# Patient Record
Sex: Female | Born: 1995 | Race: White | Hispanic: No | Marital: Single | Smoking: Never smoker
Health system: Southern US, Community
[De-identification: ages and names within clinical notes are randomized; demographics above are authoritative.]

## PROBLEM LIST (undated history)

## (undated) DIAGNOSIS — F419 Anxiety disorder, unspecified: Secondary | ICD-10-CM

## (undated) DIAGNOSIS — F329 Major depressive disorder, single episode, unspecified: Secondary | ICD-10-CM

## (undated) DIAGNOSIS — F12288 Cannabis dependence with other cannabis-induced disorder: Secondary | ICD-10-CM

## (undated) DIAGNOSIS — F32A Depression, unspecified: Secondary | ICD-10-CM

---
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
## 2015-07-11 IMAGING — CT CT ABD-PELV W/ CM
2 of 4 series · 16 of 46 positions shown, 18 images · IV contrast (omnipaque)
Comparison: Prior ultrasound from 04/09/2014

CLINICAL DATA: Initial evaluation for diffuse in the pain, greatest
within the right lower quadrant, with vomiting.

EXAM:
CT ABDOMEN AND PELVIS WITH CONTRAST
TECHNIQUE: Multidetector CT imaging of the abdomen and pelvis was performed
using the standard protocol following bolus administration of
intravenous contrast.
CONTRAST:  100 cc of Omnipaque 300.

[Series 2: routine abd pel with · axial · 0.69mm/px · z∈[-517,-82]mm · 13 of 95 slices shown, 15 images]
[im 4/95  soft-tissue]
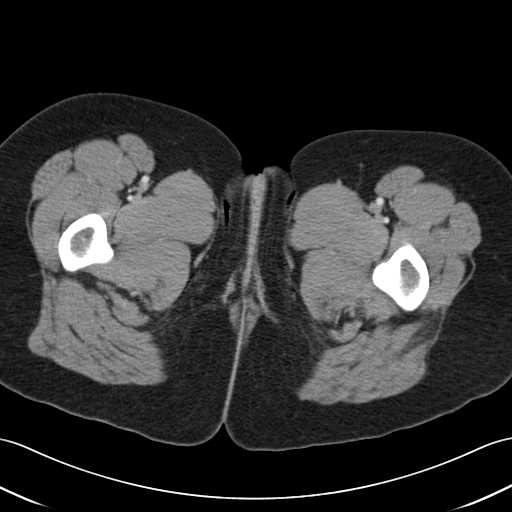
[im 4/95  bone]
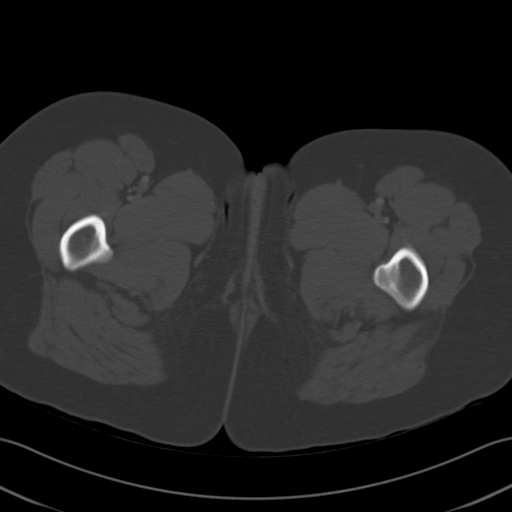
[im 12/95  soft-tissue]
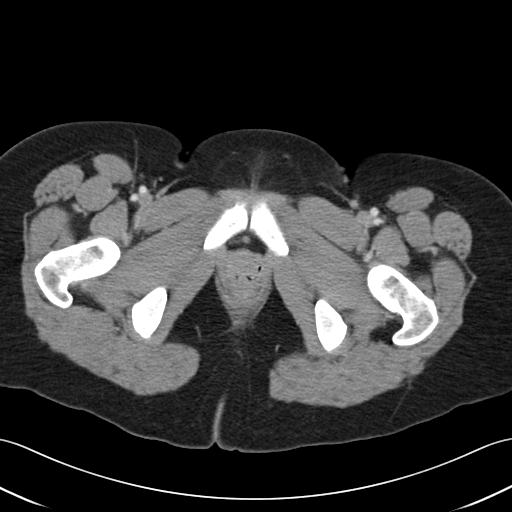
[im 20/95  soft-tissue]
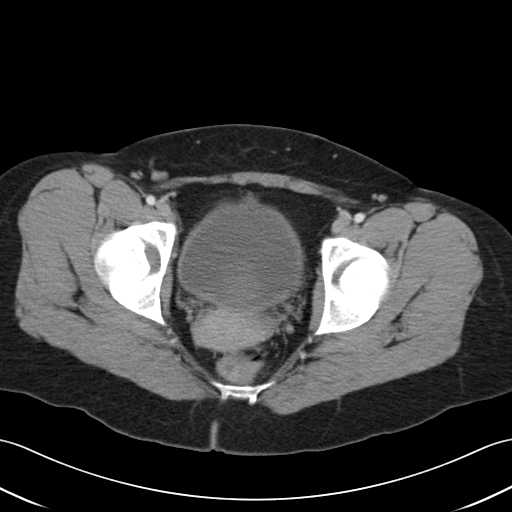
[im 28/95  soft-tissue]
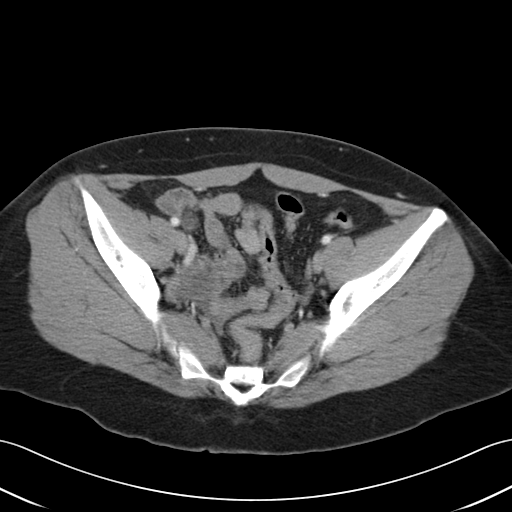
[im 32/95  soft-tissue]
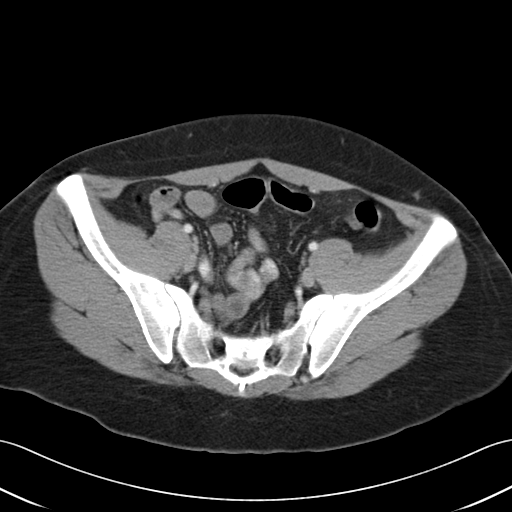
[im 40/95  soft-tissue]
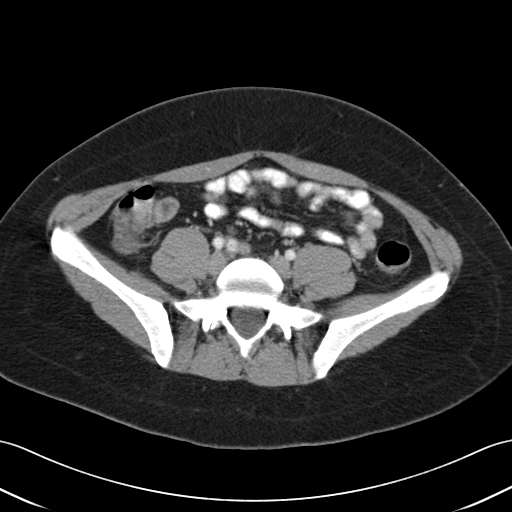
[im 48/95  soft-tissue]
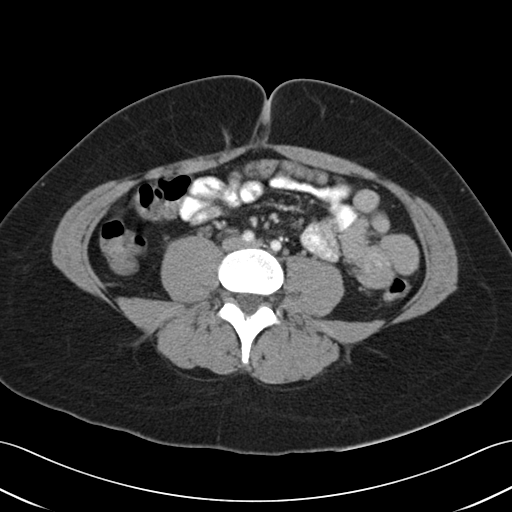
[im 55/95  soft-tissue]
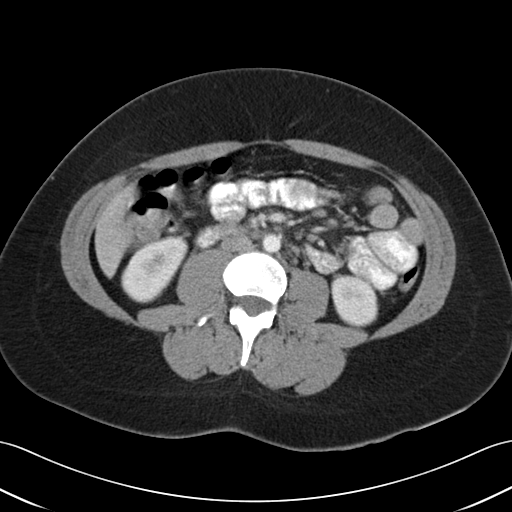
[im 63/95  soft-tissue]
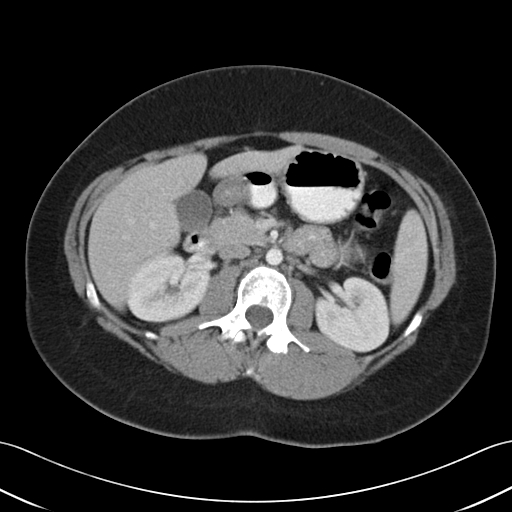
[im 63/95  bone]
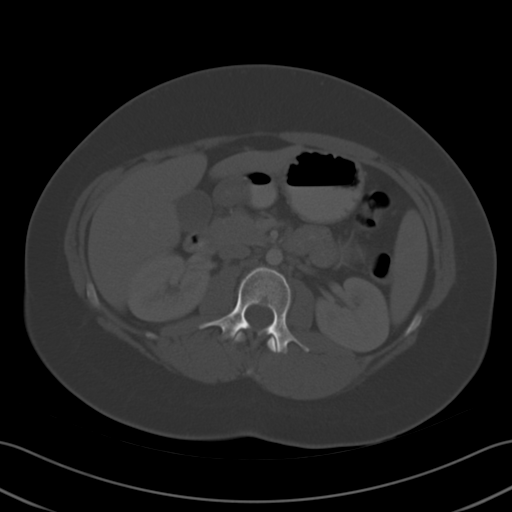
[im 67/95  soft-tissue]
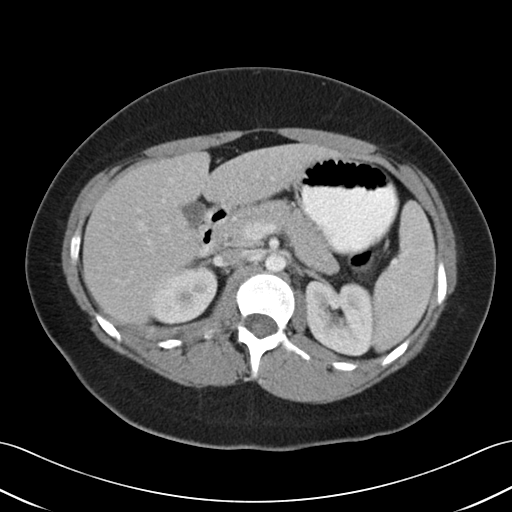
[im 75/95  soft-tissue]
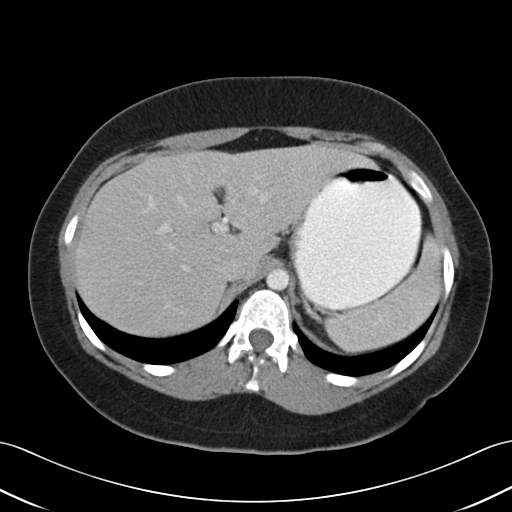
[im 83/95  soft-tissue]
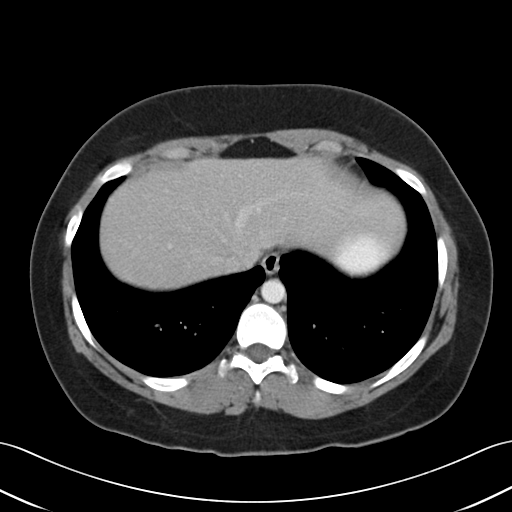
[im 91/95  soft-tissue]
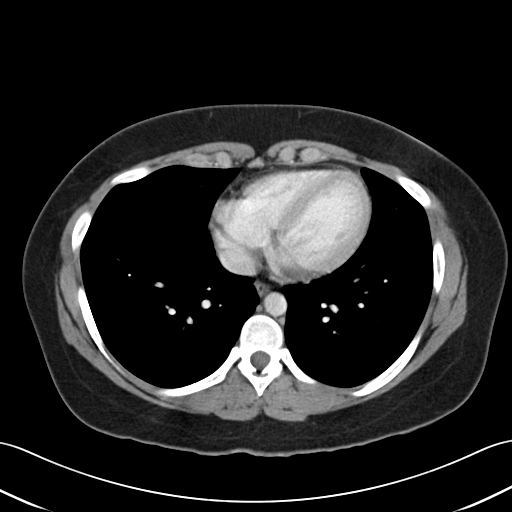

[Series 5: cor routine abd pel with · coronal · 0.96mm/px · 3 of 100 slices shown]
[im 34/100  soft-tissue]
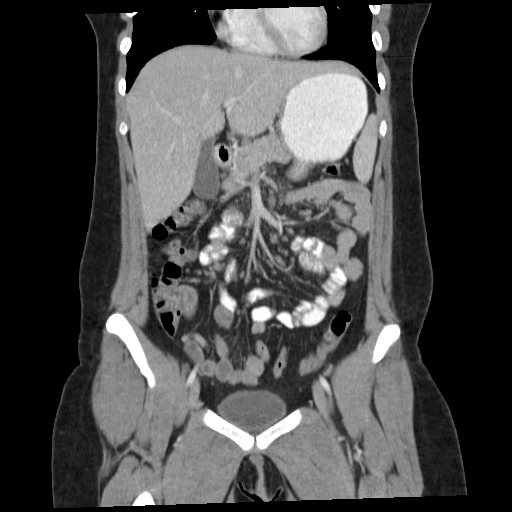
[im 45/100  soft-tissue]
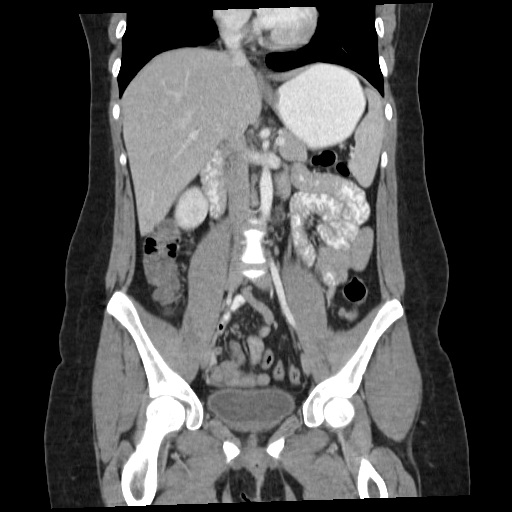
[im 56/100  soft-tissue]
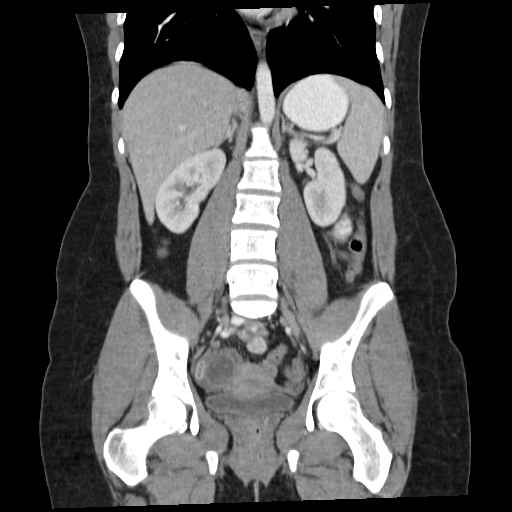

[16 of 46 positions shown; findings below may reference images not displayed]

FINDINGS: Visualized lung bases are clear. No pleural or pericardial effusion.

The liver demonstrates a normal contrast enhanced appearance.
Gallbladder within normal limits. No biliary dilatation. Spleen,
adrenal glands, and pancreas demonstrate a normal contrast enhanced
appearance.

Kidneys are equal in size with symmetric enhancement. No
nephrolithiasis, hydronephrosis, or focal enhancing renal mass.

Stomach within normal limits. No evidence for bowel obstruction.
Appendix well visualized in the right lower quadrant and is of
normal caliber measuring 5 mm in diameter. No associated
inflammatory changes to suggest acute appendicitis. No abnormal wall
thickening, mucosal enhancement, or inflammatory fat stranding seen
elsewhere about the bowels.

Mild circumferential bladder wall thickening likely related
incomplete distension. Tiny focal thickening of 5 mm at the anterior
bladder wall may reflect a small urachal remnant, of doubtful
clinical significant (series 2, image 77).

Small amount of free fluid present within the endometrial canal.
Uterus otherwise unremarkable. Left ovary normal. There is a in
x 2.8 x 2.5 cm unilocular cyst within the right ovary, likely a
normal physiologic cyst (series 2, image 70). There is an adjacent
degenerating corpus luteal cyst. Small volume free fluid present
within the pelvis, likely physiologic.

No free air. No pathologically enlarged intra-abdominal pelvic lymph
nodes. Normal intravascular enhancement seen throughout the
intra-abdominal aorta and its branch vessels.

No acute osseous abnormality. No worrisome lytic or blastic osseous
lesion.
IMPRESSION: 1. 2.9 x 2.8 x 2.5 cm unilocular cyst within the right ovary, likely
a normal physiologic cyst. There is an adjacent degenerating corpus
luteal cyst within the right ovary as well. This is felt to likely
represent the source of the patient's right lower quadrant abdominal
pain. Associated small volume free fluid within the pelvis likely
physiologic in nature.
2. Normal appendix.
3. No other acute intra-abdominal or pelvic process.

## 2017-06-06 ENCOUNTER — Encounter: Payer: Self-pay | Admitting: Emergency Medicine

## 2017-06-06 ENCOUNTER — Emergency Department
Admission: EM | Admit: 2017-06-06 | Discharge: 2017-06-06 | Disposition: A | Discharge status: Home / Self Care | Payer: Self-pay | Attending: Emergency Medicine | Admitting: Emergency Medicine

## 2017-06-06 DIAGNOSIS — R1084 Generalized abdominal pain: Secondary | ICD-10-CM | POA: Insufficient documentation

## 2017-06-06 DIAGNOSIS — R112 Nausea with vomiting, unspecified: Secondary | ICD-10-CM | POA: Insufficient documentation

## 2017-06-06 LAB — COMPREHENSIVE METABOLIC PANEL
ALT: 11 U/L — ABNORMAL LOW (ref 14–54)
ANION GAP: 12 (ref 5–15)
AST: 24 U/L (ref 15–41)
Albumin: 5.5 g/dL — ABNORMAL HIGH (ref 3.5–5.0)
Alkaline Phosphatase: 72 U/L (ref 38–126)
BILIRUBIN TOTAL: 1.4 mg/dL — AB (ref 0.3–1.2)
BUN: 16 mg/dL (ref 6–20)
CO2: 24 mmol/L (ref 22–32)
Calcium: 9.9 mg/dL (ref 8.9–10.3)
Chloride: 100 mmol/L — ABNORMAL LOW (ref 101–111)
Creatinine, Ser: 0.83 mg/dL (ref 0.44–1.00)
GFR calc Af Amer: >60 mL/min (ref >60)
Glucose, Bld: 104 mg/dL — ABNORMAL HIGH (ref 65–99)
POTASSIUM: 4.1 mmol/L (ref 3.5–5.1)
Sodium: 136 mmol/L (ref 135–145)
TOTAL PROTEIN: 9.5 g/dL — AB (ref 6.5–8.1)

## 2017-06-06 LAB — URINALYSIS, COMPLETE (UACMP) WITH MICROSCOPIC
BACTERIA UA: NONE SEEN
BILIRUBIN URINE: NEGATIVE
Glucose, UA: NEGATIVE mg/dL
Hgb urine dipstick: NEGATIVE
Ketones, ur: 80 mg/dL — AB
LEUKOCYTES UA: NEGATIVE
Nitrite: NEGATIVE
Protein, ur: 30 mg/dL — AB
SPECIFIC GRAVITY, URINE: 1.028 (ref 1.005–1.030)
pH: 6 (ref 5.0–8.0)

## 2017-06-06 LAB — CBC WITH DIFFERENTIAL/PLATELET
Basophils Absolute: 0 10*3/uL (ref 0–0.1)
Basophils Relative: 0 %
Eosinophils Absolute: 0.1 10*3/uL (ref 0–0.7)
Eosinophils Relative: 1 %
HEMATOCRIT: 45.3 % (ref 35.0–47.0)
HEMOGLOBIN: 15.5 g/dL (ref 12.0–16.0)
LYMPHS PCT: 11 %
Lymphs Abs: 1 10*3/uL (ref 1.0–3.6)
MCH: 30.4 pg (ref 26.0–34.0)
MCHC: 34.2 g/dL (ref 32.0–36.0)
MCV: 89.1 fL (ref 80.0–100.0)
MONO ABS: 0.5 10*3/uL (ref 0.2–0.9)
Monocytes Relative: 6 %
NEUTROS ABS: 8 10*3/uL — AB (ref 1.4–6.5)
Neutrophils Relative %: 82 %
Platelets: 392 10*3/uL (ref 150–440)
RBC: 5.09 MIL/uL (ref 3.80–5.20)
RDW: 12.4 % (ref 11.5–14.5)
WBC: 9.6 10*3/uL (ref 3.6–11.0)

## 2017-06-06 LAB — LIPASE, BLOOD: LIPASE: 18 U/L (ref 11–51)

## 2017-06-06 LAB — POCT PREGNANCY, URINE: PREG TEST UR: NEGATIVE

## 2017-06-06 MED ORDER — FAMOTIDINE IN NACL 20-0.9 MG/50ML-% IV SOLN
20.0000 mg | Freq: Once | INTRAVENOUS | Status: AC
  Administered 2017-06-06: 20 mg via INTRAVENOUS
  Filled 2017-06-06: qty 50

## 2017-06-06 MED ORDER — PROMETHAZINE HCL 25 MG RE SUPP: 25.0000 mg | Freq: Four times a day (QID) | RECTAL | 1 refills | Status: DC | PRN

## 2017-06-06 MED ORDER — PROMETHAZINE HCL 25 MG PO TABS
50.0000 mg | ORAL_TABLET | Freq: Once | ORAL | Status: AC
  Administered 2017-06-06: 50 mg via ORAL
  Filled 2017-06-06: qty 2

## 2017-06-06 MED ORDER — PROMETHAZINE HCL 25 MG PO TABS: 25.0000 mg | ORAL_TABLET | Freq: Four times a day (QID) | ORAL | 0 refills | Status: DC | PRN

## 2017-06-06 MED ORDER — PROMETHAZINE HCL 25 MG/ML IJ SOLN
25.0000 mg | Freq: Once | INTRAMUSCULAR | Status: AC
  Administered 2017-06-06: 25 mg via INTRAVENOUS
  Filled 2017-06-06: qty 1

## 2017-06-06 MED ORDER — MORPHINE SULFATE (PF) 4 MG/ML IV SOLN
4.0000 mg | Freq: Once | INTRAVENOUS | Status: AC
  Administered 2017-06-06: 4 mg via INTRAVENOUS
  Filled 2017-06-06: qty 1

## 2017-06-06 MED ORDER — LORAZEPAM 2 MG/ML IJ SOLN
0.5000 mg | Freq: Once | INTRAMUSCULAR | Status: AC
  Administered 2017-06-06: 0.5 mg via INTRAVENOUS
  Filled 2017-06-06: qty 1

## 2017-06-06 MED ORDER — FAMOTIDINE 20 MG PO TABS: 20.0000 mg | ORAL_TABLET | Freq: Two times a day (BID) | ORAL | 1 refills | Status: DC

## 2017-06-06 MED ORDER — SODIUM CHLORIDE 0.9 % IV SOLN
Freq: Once | INTRAVENOUS | Status: AC
  Administered 2017-06-06: 08:00:00 via INTRAVENOUS

## 2017-06-06 NOTE — ED Notes (Addendum)
Pt states she would like her boyfriend to be out of the room when the MD goes in to update her.  Informed Dr. Mayford KnifeWilliams of this.  Also advised patient that we are still waiting for UA. Pt states she is unable to go at this time.

## 2017-06-06 NOTE — ED Triage Notes (Signed)
Pt with abd pain and n/v since yesterday. Unable to hold anything down

## 2017-06-06 NOTE — ED Provider Notes (Signed)
[facility] Emergency Department Provider Note       Time seen: ----------------------------------------- 7:45 AM on 06/06/2017 -----------------------------------------   I have reviewed the triage vital signs and the nursing notes.  HISTORY   Chief Complaint Abdominal Pain and Emesis    HPI Carolyn Fritz is a 22 y.o. female with no significant past medical history who presents to the ED for abdominal pain with nausea vomiting since yesterday.  Patient is also unable to hold anything down.  Discomfort is 10 out of 10 in the abdomen diffusely, nothing makes it better.  Patient has had multiple presentations to various hospitals in the area in the past.  She denies daily marijuana abuse.  She was told recently to stop antacids because it may cause kidney failure.  History reviewed. No pertinent past medical history.  There are no active problems to display for this patient.   History reviewed. No pertinent surgical history.  Allergies Patient has no known allergies.  Social History Social History   Tobacco Use  . Smoking status: Never Smoker  . Smokeless tobacco: Never Used  Substance Use Topics  . Alcohol use: No    Frequency: Never  . Drug use: No    Review of Systems Constitutional: Negative for fever. Cardiovascular: Negative for chest pain. Respiratory: Negative for shortness of breath. Gastrointestinal: Positive for abdominal pain, vomiting Genitourinary: Negative for dysuria. Musculoskeletal: Negative for back pain. Skin: Negative for rash. Neurological: Negative for headaches, focal weakness or numbness.  All systems negative/normal/unremarkable except as stated in the HPI  ____________________________________________   PHYSICAL EXAM:  VITAL SIGNS: ED Triage Vitals [06/06/17 0741]  Enc Vitals Group     BP 136/66     Pulse Rate 66     Resp 20     Temp (!) 97.4 F (36.3 C)     Temp Source Oral     SpO2 100 %   Weight 130 lb (59 kg)     Height      Head Circumference      Peak Flow      Pain Score 10     Pain Loc      Pain Edu?      Excl. in GC?    Constitutional: Alert and oriented.  Mild distress Eyes: Conjunctivae are normal. Normal extraocular movements. ENT   Head: Normocephalic and atraumatic.   Nose: No congestion/rhinnorhea.   Mouth/Throat: Mucous membranes are moist.   Neck: No stridor. Cardiovascular: Normal rate, regular rhythm. No murmurs, rubs, or gallops. Respiratory: Normal respiratory effort without tachypnea nor retractions. Breath sounds are clear and equal bilaterally. No wheezes/rales/rhonchi. Gastrointestinal: Soft nonfocal tenderness.  Normal bowel sounds Musculoskeletal: Nontender with normal range of motion in extremities. No lower extremity tenderness nor edema. Neurologic:  Normal speech and language. No gross focal neurologic deficits are appreciated.  Skin:  Skin is warm, dry and intact. No rash noted. Psychiatric: Mood and affect are normal. Speech and behavior are normal.  ____________________________________________  ED COURSE:  As part of my medical decision making, I reviewed the following data within the electronic MEDICAL RECORD NUMBER History obtained from family if available, nursing notes, old chart and ekg, as well as notes from prior ED visits. Patient presented for abdominal pain and vomiting, we will assess with labs and imaging as indicated at this time.   Procedures ____________________________________________   LABS (pertinent positives/negatives)  Labs Reviewed  CBC WITH DIFFERENTIAL/PLATELET - Abnormal; Notable for the following components:  Result Value   Neutro Abs 8.0 (*)    All other components within normal limits  COMPREHENSIVE METABOLIC PANEL - Abnormal; Notable for the following components:   Chloride 100 (*)    Glucose, Bld 104 (*)    Total Protein 9.5 (*)    Albumin 5.5 (*)    ALT 11 (*)    Total Bilirubin 1.4  (*)    All other components within normal limits  LIPASE, BLOOD  URINALYSIS, COMPLETE (UACMP) WITH MICROSCOPIC  POCT PREGNANCY, URINE   ___________________________________________  DIFFERENTIAL DIAGNOSIS   Cyclic vomiting syndrome, GERD, peptic ulcer disease, dehydration, likely abnormally, pregnancy  FINAL ASSESSMENT AND PLAN  Vomiting   Plan: The patient had presented for vomiting and abdominal pain. Patient's labs are reassuring.  She was feeling better after fluids and antiemetics.  She will be discharged with antacids and antiemetics and is stable for outpatient follow-up.   Ulice DashJohnathan E Othniel Maret, MD   Note: This note was generated in part or whole with voice recognition software. Voice recognition is usually quite accurate but there are transcription errors that can and very often do occur. I apologize for any typographical errors that were not detected and corrected.     Emily FilbertWilliams, Jerriah Ines E, MD 06/06/17 1044

## 2017-06-23 ENCOUNTER — Other Ambulatory Visit: Payer: Self-pay

## 2017-06-23 ENCOUNTER — Encounter: Payer: Self-pay | Admitting: *Deleted

## 2017-06-23 DIAGNOSIS — R079 Chest pain, unspecified: Secondary | ICD-10-CM | POA: Insufficient documentation

## 2017-06-23 DIAGNOSIS — R1110 Vomiting, unspecified: Secondary | ICD-10-CM | POA: Insufficient documentation

## 2017-06-23 DIAGNOSIS — Z5321 Procedure and treatment not carried out due to patient leaving prior to being seen by health care provider: Secondary | ICD-10-CM | POA: Insufficient documentation

## 2017-06-23 DIAGNOSIS — R109 Unspecified abdominal pain: Secondary | ICD-10-CM | POA: Insufficient documentation

## 2017-06-23 MED ORDER — ONDANSETRON 8 MG PO TBDP
ORAL_TABLET | ORAL | Status: DC
Start: 2017-06-23 — End: 2017-06-24
  Filled 2017-06-23: qty 1

## 2017-06-23 MED ORDER — ONDANSETRON 4 MG PO TBDP
4.0000 mg | ORAL_TABLET | Freq: Once | ORAL | Status: AC | PRN
  Administered 2017-06-23: 4 mg via ORAL

## 2017-06-23 NOTE — ED Triage Notes (Signed)
Pt to ED reporting 3 days of chest pain and abd pain with vomiting. No diarrhea or fever reported. Pt also reports dysuria for the past three days as well. No cough or congestion at this time.

## 2017-06-24 ENCOUNTER — Emergency Department
Admission: EM | Admit: 2017-06-24 | Discharge: 2017-06-24 | Disposition: A | Discharge status: Home / Self Care | Payer: Self-pay | Attending: Emergency Medicine | Admitting: Emergency Medicine

## 2017-06-24 ENCOUNTER — Emergency Department: Payer: Self-pay

## 2017-06-24 LAB — COMPREHENSIVE METABOLIC PANEL
ALBUMIN: 5 g/dL (ref 3.5–5.0)
ALK PHOS: 61 U/L (ref 38–126)
ALT: 9 U/L — AB (ref 14–54)
AST: 26 U/L (ref 15–41)
Anion gap: 12 (ref 5–15)
BILIRUBIN TOTAL: 0.7 mg/dL (ref 0.3–1.2)
BUN: 10 mg/dL (ref 6–20)
CALCIUM: 9.6 mg/dL (ref 8.9–10.3)
CO2: 21 mmol/L — ABNORMAL LOW (ref 22–32)
CREATININE: 0.69 mg/dL (ref 0.44–1.00)
Chloride: 106 mmol/L (ref 101–111)
GFR calc Af Amer: >60 mL/min (ref >60)
GFR calc non Af Amer: >60 mL/min (ref >60)
GLUCOSE: 108 mg/dL — AB (ref 65–99)
Potassium: 3.7 mmol/L (ref 3.5–5.1)
Sodium: 139 mmol/L (ref 135–145)
TOTAL PROTEIN: 8.7 g/dL — AB (ref 6.5–8.1)

## 2017-06-24 LAB — CBC
HEMATOCRIT: 41.8 % (ref 35.0–47.0)
Hemoglobin: 14.1 g/dL (ref 12.0–16.0)
MCH: 30 pg (ref 26.0–34.0)
MCHC: 33.8 g/dL (ref 32.0–36.0)
MCV: 88.6 fL (ref 80.0–100.0)
Platelets: 378 10*3/uL (ref 150–440)
RBC: 4.71 MIL/uL (ref 3.80–5.20)
RDW: 12.4 % (ref 11.5–14.5)
WBC: 14.6 10*3/uL — ABNORMAL HIGH (ref 3.6–11.0)

## 2017-06-24 LAB — TROPONIN I: Troponin I: <0.03 ng/mL (ref <0.03)

## 2017-06-24 LAB — LIPASE, BLOOD: Lipase: 20 U/L (ref 11–51)

## 2018-05-09 ENCOUNTER — Other Ambulatory Visit: Payer: Self-pay

## 2018-05-09 ENCOUNTER — Emergency Department
Admission: EM | Admit: 2018-05-09 | Discharge: 2018-05-09 | Disposition: A | Discharge status: Home / Self Care | Payer: Self-pay | Attending: Emergency Medicine | Admitting: Emergency Medicine

## 2018-05-09 ENCOUNTER — Encounter: Payer: Self-pay | Admitting: Intensive Care

## 2018-05-09 DIAGNOSIS — R112 Nausea with vomiting, unspecified: Secondary | ICD-10-CM | POA: Insufficient documentation

## 2018-05-09 DIAGNOSIS — N390 Urinary tract infection, site not specified: Secondary | ICD-10-CM | POA: Insufficient documentation

## 2018-05-09 HISTORY — DX: Major depressive disorder, single episode, unspecified: F32.9

## 2018-05-09 HISTORY — DX: Cannabis dependence with other cannabis-induced disorder: F12.288

## 2018-05-09 HISTORY — DX: Anxiety disorder, unspecified: F41.9

## 2018-05-09 HISTORY — DX: Depression, unspecified: F32.A

## 2018-05-09 LAB — CBC
HCT: 49.6 % — ABNORMAL HIGH (ref 36.0–46.0)
Hemoglobin: 16.6 g/dL — ABNORMAL HIGH (ref 12.0–15.0)
MCH: 29.7 pg (ref 26.0–34.0)
MCHC: 33.5 g/dL (ref 30.0–36.0)
MCV: 88.7 fL (ref 80.0–100.0)
Platelets: 349 10*3/uL (ref 150–400)
RBC: 5.59 MIL/uL — ABNORMAL HIGH (ref 3.87–5.11)
RDW: 12 % (ref 11.5–15.5)
WBC: 9.7 10*3/uL (ref 4.0–10.5)
nRBC: 0 % (ref 0.0–0.2)

## 2018-05-09 LAB — COMPREHENSIVE METABOLIC PANEL
ALT: 9 U/L (ref 0–44)
AST: 19 U/L (ref 15–41)
Albumin: 5.4 g/dL — ABNORMAL HIGH (ref 3.5–5.0)
Alkaline Phosphatase: 51 U/L (ref 38–126)
Anion gap: 13 (ref 5–15)
BUN: 15 mg/dL (ref 6–20)
CO2: 21 mmol/L — ABNORMAL LOW (ref 22–32)
Calcium: 10 mg/dL (ref 8.9–10.3)
Chloride: 105 mmol/L (ref 98–111)
Creatinine, Ser: 0.73 mg/dL (ref 0.44–1.00)
GFR calc Af Amer: >60 mL/min (ref >60)
GFR calc non Af Amer: >60 mL/min (ref >60)
Glucose, Bld: 116 mg/dL — ABNORMAL HIGH (ref 70–99)
Potassium: 4.2 mmol/L (ref 3.5–5.1)
Sodium: 139 mmol/L (ref 135–145)
Total Bilirubin: 0.9 mg/dL (ref 0.3–1.2)
Total Protein: 9 g/dL — ABNORMAL HIGH (ref 6.5–8.1)

## 2018-05-09 LAB — URINALYSIS, COMPLETE (UACMP) WITH MICROSCOPIC
Bilirubin Urine: NEGATIVE
Glucose, UA: NEGATIVE mg/dL
Hgb urine dipstick: NEGATIVE
Ketones, ur: 80 mg/dL — AB
NITRITE: POSITIVE — AB
Protein, ur: 30 mg/dL — AB
Specific Gravity, Urine: 1.027 (ref 1.005–1.030)
pH: 5 (ref 5.0–8.0)

## 2018-05-09 LAB — URINE DRUG SCREEN, QUALITATIVE (ARMC ONLY)
Amphetamines, Ur Screen: NOT DETECTED
Barbiturates, Ur Screen: NOT DETECTED
Benzodiazepine, Ur Scrn: POSITIVE — AB
Cannabinoid 50 Ng, Ur ~~LOC~~: POSITIVE — AB
Cocaine Metabolite,Ur ~~LOC~~: NOT DETECTED
MDMA (ECSTASY) UR SCREEN: NOT DETECTED
Methadone Scn, Ur: NOT DETECTED
OPIATE, UR SCREEN: POSITIVE — AB
Phencyclidine (PCP) Ur S: NOT DETECTED
Tricyclic, Ur Screen: POSITIVE — AB

## 2018-05-09 LAB — POC URINE PREG, ED: Preg Test, Ur: NEGATIVE

## 2018-05-09 LAB — LIPASE, BLOOD: Lipase: 20 U/L (ref 11–51)

## 2018-05-09 MED ORDER — SODIUM CHLORIDE 0.9 % IV SOLN
1.0000 g | Freq: Once | INTRAVENOUS | Status: AC
  Administered 2018-05-09: 1 g via INTRAVENOUS
  Filled 2018-05-09: qty 10

## 2018-05-09 MED ORDER — MORPHINE SULFATE (PF) 4 MG/ML IV SOLN
4.0000 mg | Freq: Once | INTRAVENOUS | Status: AC
  Administered 2018-05-09: 4 mg via INTRAVENOUS
  Filled 2018-05-09: qty 1

## 2018-05-09 MED ORDER — CEPHALEXIN 500 MG PO CAPS: 500.0000 mg | ORAL_CAPSULE | Freq: Three times a day (TID) | ORAL | 0 refills | Status: AC

## 2018-05-09 MED ORDER — PROMETHAZINE HCL 25 MG/ML IJ SOLN
25.0000 mg | Freq: Once | INTRAMUSCULAR | Status: AC
  Administered 2018-05-09: 25 mg via INTRAVENOUS
  Filled 2018-05-09: qty 1

## 2018-05-09 MED ORDER — SODIUM CHLORIDE 0.9 % IV BOLUS
1000.0000 mL | Freq: Once | INTRAVENOUS | Status: AC
  Administered 2018-05-09: 1000 mL via INTRAVENOUS

## 2018-05-09 MED ORDER — PROMETHAZINE HCL 25 MG/ML IJ SOLN
25.0000 mg | Freq: Once | INTRAMUSCULAR | Status: AC
  Administered 2018-05-09: 25 mg via INTRAVENOUS

## 2018-05-09 MED ORDER — PROMETHAZINE HCL 25 MG PO TABS: 25.0000 mg | ORAL_TABLET | Freq: Four times a day (QID) | ORAL | 0 refills | Status: AC | PRN

## 2018-05-09 MED ORDER — PROMETHAZINE HCL 25 MG RE SUPP: 25.0000 mg | Freq: Four times a day (QID) | RECTAL | 1 refills | Status: AC | PRN

## 2018-05-09 MED ORDER — ONDANSETRON 4 MG PO TBDP
4.0000 mg | ORAL_TABLET | Freq: Once | ORAL | Status: AC | PRN
  Administered 2018-05-09: 4 mg via ORAL
  Filled 2018-05-09: qty 1

## 2018-05-09 NOTE — ED Triage Notes (Signed)
Lab called for assistance with blood draw 

## 2018-05-09 NOTE — ED Notes (Signed)
Pt ambulatory with steady gait noted  

## 2018-05-09 NOTE — ED Notes (Signed)
Resumed care from [place] rn.  Pt alert  Iv in place.  Pt states she is feeling better.

## 2018-05-09 NOTE — ED Triage Notes (Signed)
Patient c/o emesis since 12:30 this AM and abd pain.

## 2018-05-09 NOTE — ED Provider Notes (Signed)
[facility] Emergency Department Provider Note  Time seen: 11:14 AM  I have reviewed the triage vital signs and the nursing notes.   HISTORY  Chief Complaint Emesis and Abdominal Pain    HPI Carolyn Fritz is a 23 y.o. female with a past medical history of anxiety, hyperemesis, depression, presents to the emergency department for nausea vomiting and abdominal burning.  According to the patient since last night she has been experiencing nausea and vomiting as well as abdominal burning describes a burning is mild to moderate, nausea is moderate to severe.  States she has not been able to keep down any fluids today.  Patient has a history of intermittent vomiting, at one point thought be due to cannabinoids although the patient states she is stopped using marijuana and continues to have episodes of nausea and vomiting.  Denies any diarrhea.  Denies any fever.   Past Medical History:  Diagnosis Date  . Anxiety   . 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 Depression     There are no active problems to display for this patient.   History reviewed. No pertinent surgical history.  Prior to Admission medications   Medication Sig Start Date End Date Taking? Authorizing Provider  famotidine (PEPCID) 20 MG tablet Take 1 tablet (20 mg total) by mouth 2 (two) times daily. 06/06/17   Emily Filbert, MD  promethazine (PHENERGAN) 25 MG suppository Place 1 suppository (25 mg total) rectally every 6 (six) hours as needed for nausea. 06/06/17 06/06/18  Emily Filbert, MD  promethazine (PHENERGAN) 25 MG tablet Take 1 tablet (25 mg total) by mouth every 6 (six) hours as needed for nausea or vomiting. 06/06/17   Emily Filbert, MD    No Known Allergies  History reviewed. No pertinent family history.  Social History Social History   Tobacco Use  . Smoking status: Never Smoker  . Smokeless tobacco: Never Used   Substance Use Topics  . Alcohol use: No    Frequency: Never  . Drug use: No    Review of Systems Constitutional: Negative for fever Cardiovascular: Negative for chest pain. Respiratory: Negative for shortness of breath. Gastrointestinal: Mid to upper abdominal burning per patient.  Negative for diarrhea.  Positive for nausea vomiting. Genitourinary: Negative for urinary compaints Musculoskeletal: Negative for musculoskeletal complaints Skin: Negative for skin complaints  Neurological: Negative for headache All other ROS negative  ____________________________________________   PHYSICAL EXAM:  VITAL SIGNS: ED Triage Vitals  Enc Vitals Group     BP 05/09/18 0946 117/76     Pulse Rate 05/09/18 0946 76     Resp 05/09/18 0946 16     Temp 05/09/18 0946 98.2 F (36.8 C)     Temp Source 05/09/18 0946 Oral     SpO2 05/09/18 0946 100 %     Weight 05/09/18 0947 145 lb (65.8 kg)     Height 05/09/18 0947 5\' 4"  (1.626 m)     Head Circumference --      Peak Flow --      Pain Score 05/09/18 0947 7     Pain Loc --      Pain Edu? --      Excl. in GC? --    Constitutional: Alert and oriented. Well appearing and in no distress. Eyes: Normal exam ENT   Head: Normocephalic and atraumatic   Mouth/Throat: Mucous membranes are moist. Cardiovascular: Normal rate, regular rhythm. No murmur Respiratory: Normal  respiratory effort without tachypnea nor retractions. Breath sounds are clear  Gastrointestinal: Soft, mild mid to upper abdominal tenderness to palpation patient describes as a burning.  No rebound guarding or distention. Musculoskeletal: Nontender with normal range of motion in all extremities.  Neurologic:  Normal speech and language. No gross focal neurologic deficits Skin:  Skin is warm, dry and intact.  Psychiatric: Mood and affect are normal.   ____________________________________________    INITIAL IMPRESSION / ASSESSMENT AND PLAN / ED COURSE  Pertinent labs &  imaging results that were available during my care of the patient were reviewed by me and considered in my medical decision making (see chart for details).  Patient presents to the emergency department for nausea and vomiting.  Patient states a history of similar events in the past, states Phenergan has worked well for nausea in the past.  We will check labs, treat with Phenergan, IV fluids while awaiting lab work.  Patient has minimal tenderness in the epigastrium and mid abdomen currently.  Could very likely be the result of frequent vomiting.  Differential this time would include cyclical vomiting, cannabinoid induced hyperemesis, gastroenteritis, gastritis, gastric or peptic ulcer disease.  Patient's urinalysis is nitrite positive with bacteria patient dosed IV Rocephin.  Patient states she is feeling somewhat better.  We will discharge with Phenergan tablets as well as suppositories.  Encourage oral hydration and have the patient follow-up with her doctor.  We will also discharge on antibiotics for urinary tract infection.  Patient agreeable to plan of care.  ____________________________________________   FINAL CLINICAL IMPRESSION(S) / ED DIAGNOSES  Nausea vomiting Urinary tract infection   Minna Antis, MD 05/09/18 1537

## 2018-05-09 NOTE — ED Notes (Signed)
Pt unable to void at this time. 

## 2018-05-09 NOTE — ED Notes (Signed)
Pt has n/v  md aware   meds given again for nausea.

## 2018-05-09 NOTE — ED Notes (Signed)
Drinking sips of ale

## 2018-05-09 NOTE — ED Notes (Addendum)
Pt sipping water at this time with no emesis noted.

## 2018-05-12 LAB — URINE CULTURE: Culture: >=100000 — AB
